# Patient Record
Sex: Male | Born: 1955 | Race: White | Hispanic: No | Marital: Single | Smoking: Current every day smoker
Health system: Southern US, Community
[De-identification: ages and names within clinical notes are randomized; demographics above are authoritative.]

## PROBLEM LIST (undated history)

## (undated) DIAGNOSIS — F419 Anxiety disorder, unspecified: Secondary | ICD-10-CM

## (undated) DIAGNOSIS — Z720 Tobacco use: Secondary | ICD-10-CM

## (undated) HISTORY — DX: Tobacco use: Z72.0

---

## 2001-02-23 ENCOUNTER — Encounter: Payer: Self-pay | Admitting: *Deleted

## 2001-02-23 ENCOUNTER — Emergency Department (HOSPITAL_COMMUNITY): Admission: EM | Admit: 2001-02-23 | Discharge: 2001-02-24 | Payer: Self-pay | Admitting: *Deleted

## 2004-03-17 HISTORY — PX: HERNIA REPAIR: SHX51

## 2004-09-23 ENCOUNTER — Encounter (INDEPENDENT_AMBULATORY_CARE_PROVIDER_SITE_OTHER): Payer: Self-pay | Admitting: *Deleted

## 2004-09-23 ENCOUNTER — Ambulatory Visit (HOSPITAL_COMMUNITY): Admission: RE | Admit: 2004-09-23 | Discharge: 2004-09-23 | Payer: Self-pay | Admitting: General Surgery

## 2008-07-14 ENCOUNTER — Emergency Department (HOSPITAL_COMMUNITY): Admission: EM | Admit: 2008-07-14 | Discharge: 2008-07-14 | Payer: Self-pay | Admitting: Emergency Medicine

## 2008-12-30 ENCOUNTER — Emergency Department (HOSPITAL_COMMUNITY): Admission: EM | Admit: 2008-12-30 | Discharge: 2008-12-30 | Payer: Self-pay | Admitting: Family Medicine

## 2009-01-03 ENCOUNTER — Emergency Department (HOSPITAL_COMMUNITY): Admission: EM | Admit: 2009-01-03 | Discharge: 2009-01-03 | Payer: Self-pay | Admitting: Emergency Medicine

## 2009-04-17 ENCOUNTER — Emergency Department (HOSPITAL_COMMUNITY): Admission: EM | Admit: 2009-04-17 | Discharge: 2009-04-17 | Payer: Self-pay | Admitting: Family Medicine

## 2010-06-26 LAB — DIFFERENTIAL
Basophils Absolute: 0.1 10*3/uL (ref 0.0–0.1)
Basophils Relative: 1 % (ref 0–1)
Eosinophils Absolute: 0.1 10*3/uL (ref 0.0–0.7)
Neutro Abs: 4.7 10*3/uL (ref 1.7–7.7)
Neutrophils Relative %: 58 % (ref 43–77)

## 2010-06-26 LAB — BASIC METABOLIC PANEL
BUN: 10 mg/dL (ref 6–23)
CO2: 25 mEq/L (ref 19–32)
Calcium: 9.4 mg/dL (ref 8.4–10.5)
Creatinine, Ser: 0.89 mg/dL (ref 0.4–1.5)
GFR calc Af Amer: >60 mL/min (ref >60)
Glucose, Bld: 95 mg/dL (ref 70–99)

## 2010-06-26 LAB — POCT CARDIAC MARKERS
Myoglobin, poc: 30.9 ng/mL (ref 12–200)
Myoglobin, poc: 43 ng/mL (ref 12–200)
Troponin i, poc: <0.05 ng/mL (ref 0.00–0.09)

## 2010-06-26 LAB — CBC
MCHC: 34.9 g/dL (ref 30.0–36.0)
RDW: 13.5 % (ref 11.5–15.5)

## 2010-08-02 NOTE — Op Note (Signed)
NAME:  Gabriel Barry, Gabriel Barry NO.:  [id number]   MEDICAL RECORD NO.:  [id number]          PATIENT TYPE:  AMB   LOCATION:  DAY                          FACILITY:  [facility]   PHYSICIAN:  Ollen Gross. Vernell Morgans, M.D. DATE OF BIRTH:  1956/03/06   DATE OF PROCEDURE:  09/23/2004  DATE OF DISCHARGE:                                 OPERATIVE REPORT   PREOPERATIVE DIAGNOSIS:  Left inguinal hernia.   POSTOP DIAGNOSIS:  Left indirect inguinal hernia.   PROCEDURE:  Left inguinal hernia repair with mesh.   SURGEON:  Ollen Gross. Carolynne Edouard, M.D.   ANESTHESIA:  General endotracheal.   PROCEDURE:  After informed consent was obtained, the patient was brought to  the operating room and placed in the supine position on the operating room  table. After adequate induction of general anesthesia, the patient's abdomen  and left groin were prepped with Betadine and draped in usual sterile  manner. The left groin was then infiltrated 1/4% Marcaine and a small  incision was made from the edge of the pubic tubercle on the left side  towards the anterior iliac spine for a distance of about 5 cm.  This  incision was carried down through the skin and subcutaneous tissue sharply  with the electrocautery. Small bridging vein was encountered that was  clamped with hemostats, divided, and ligated with 3-0 silk ties. The  dissection then carried through the subcutaneous tissue with the  electrocautery until the fascia of the external oblique was encountered.   The external oblique fascia was opened along its fibers towards the apex of  the external ring. Using a 15-blade knife and Metzenbaum scissors and  Weitlaner retractor was deployed to open the wound.  The ilioinguinal nerve  was identified and appeared to be involved with some scar tissue.  It was  ligated proximally and distally with hemostats.  The area was clamped  proximally and distally with hemostats, divided and ligated with 3-0 silk  ties. Blunt  dissection was then carried out at the edge of the pubic  tubercle until the cord structures could be surrounded between 2 fingers. A  1/2-inch Penrose drain was then placed around the cord structures for  retraction purposes. The cord structures were then gently skeletonized by a  combination of blunt hemostat dissection and sharp dissection with the  electrocautery. The hernia sac was identified and was gently separated from  the rest of the cord structures.   The hernia sac was opened. There were no visceral contents within the sac.  The sac was then ligated at its base with a 2-0 silk suture ligature; and  then the sac was excised and sent to pathology for identification. The stump  of the sac was allowed to retract back beneath the transversalis layer.  A  piece of 3 x 6 mesh was then chosen and cut-to-fit.  The mesh was sewed  inferiorly to the shelving edge of the inguinal ligament with a running 2-0  Prolene stitch. Tails were cut in the mesh laterally which were wrapped  around the cord structures superiorly. The mesh was sewed  to the muscular  aponeurotic strength layer of the transversalis using interrupted 2-0  Prolene vertical mattress stitches. The tails, again, were wrapped around  the cord structures and anchored laterally to the shelving edge of the  inguinal ligament with an interrupted 2-0 Prolene stitch.  Once this was accomplished, the mesh was in good position without any  tension.   The wound was irrigated copious amounts of saline. The external oblique  fascia was then reapproximated with a running 2-0 Vicryl stitch. The wound  was then infiltrated with 1/4% Marcaine. The subcutaneous fascia was  reapproximated with a running 3-0 Vicryl stitch, and the skin was closed  with a running for Monocryl subcuticular stitch. Benzoin, Steri-Strips, and  sterile dressings were applied. The patient tolerated the procedure well.  At the end of the case all needle, sponge, and  instrument counts were  correct. The patient was awake and taken recovery in stable condition.       PST/MEDQ  D:  09/23/2004  T:  09/23/2004  Job:  [id number]

## 2010-10-18 ENCOUNTER — Emergency Department (HOSPITAL_COMMUNITY)
Admission: EM | Admit: 2010-10-18 | Discharge: 2010-10-18 | Disposition: A | Discharge status: Home / Self Care | Payer: Self-pay | Attending: Emergency Medicine | Admitting: Emergency Medicine

## 2010-10-18 ENCOUNTER — Emergency Department (HOSPITAL_COMMUNITY): Payer: Self-pay

## 2010-10-18 DIAGNOSIS — F411 Generalized anxiety disorder: Secondary | ICD-10-CM | POA: Insufficient documentation

## 2010-10-18 DIAGNOSIS — R61 Generalized hyperhidrosis: Secondary | ICD-10-CM | POA: Insufficient documentation

## 2010-10-18 DIAGNOSIS — R0789 Other chest pain: Secondary | ICD-10-CM | POA: Insufficient documentation

## 2010-10-18 DIAGNOSIS — R209 Unspecified disturbances of skin sensation: Secondary | ICD-10-CM | POA: Insufficient documentation

## 2010-10-18 DIAGNOSIS — R0602 Shortness of breath: Secondary | ICD-10-CM | POA: Insufficient documentation

## 2010-10-18 DIAGNOSIS — R29898 Other symptoms and signs involving the musculoskeletal system: Secondary | ICD-10-CM | POA: Insufficient documentation

## 2010-10-18 LAB — DIFFERENTIAL
Basophils Absolute: 0 10*3/uL (ref 0.0–0.1)
Lymphocytes Relative: 16 % (ref 12–46)
Monocytes Absolute: 1 10*3/uL (ref 0.1–1.0)
Neutro Abs: 5.9 10*3/uL (ref 1.7–7.7)
Neutrophils Relative %: 71 % (ref 43–77)

## 2010-10-18 LAB — PRO B NATRIURETIC PEPTIDE: Pro B Natriuretic peptide (BNP): 52.2 pg/mL (ref 0–125)

## 2010-10-18 LAB — PROTIME-INR
INR: 0.99 (ref 0.00–1.49)
Prothrombin Time: 13.3 seconds (ref 11.6–15.2)

## 2010-10-18 LAB — CK TOTAL AND CKMB (NOT AT ARMC)
CK, MB: 2.9 ng/mL (ref 0.3–4.0)
Total CK: 104 U/L (ref 7–232)
Total CK: 85 U/L (ref 7–232)

## 2010-10-18 LAB — POCT I-STAT, CHEM 8
BUN: 9 mg/dL (ref 6–23)
Creatinine, Ser: 0.8 mg/dL (ref 0.50–1.35)
Hemoglobin: 14.3 g/dL (ref 13.0–17.0)
Potassium: 3.7 mEq/L (ref 3.5–5.1)
Sodium: 136 mEq/L (ref 135–145)

## 2010-10-18 LAB — APTT: aPTT: 31 seconds (ref 24–37)

## 2010-10-18 LAB — CBC
HCT: 39.5 % (ref 39.0–52.0)
Hemoglobin: 14.1 g/dL (ref 13.0–17.0)
MCHC: 35.7 g/dL (ref 30.0–36.0)
RBC: 4.21 MIL/uL — ABNORMAL LOW (ref 4.22–5.81)

## 2010-10-18 LAB — TROPONIN I: Troponin I: <0.3 ng/mL (ref <0.30)

## 2013-02-27 IMAGING — CR DG CHEST 1V PORT
1 series · 1 of 1 positions shown · non-contrast
Comparison: 01/03/2009

CLINICAL DATA: Chest pain

PORTABLE CHEST - 1 VIEW

[view not recorded]
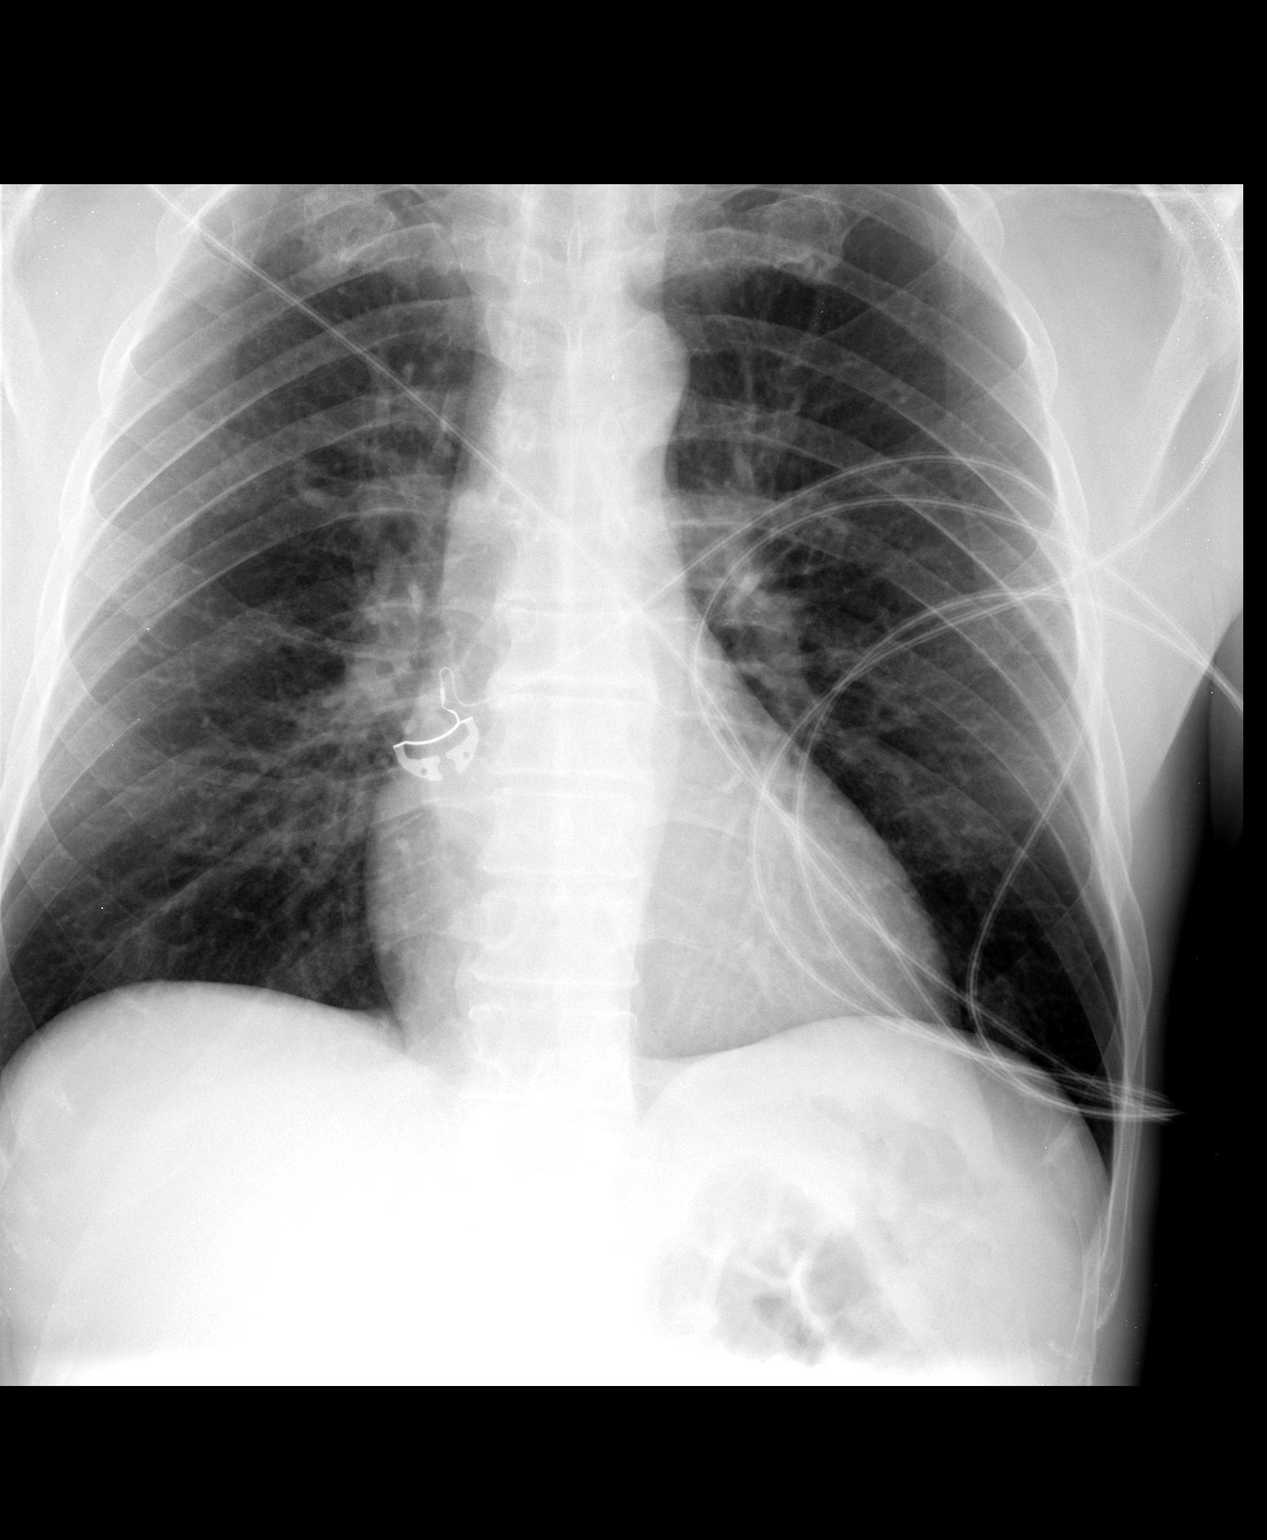

[1 of 1 positions shown; findings below may reference images not displayed]

FINDINGS: The lungs are clear bilaterally.  No confluent airspace
opacities, pleural effuions or pneumothoracies are seen.  The heart
is normal in size and contour.  The upper abdomen and osseous
structures are normal. Of note, the extreme right costophrenic
angle is not included on this exam.
IMPRESSION: No acute cardiopulmonary disease.

## 2013-10-15 ENCOUNTER — Encounter (HOSPITAL_COMMUNITY): Payer: Self-pay | Admitting: Emergency Medicine

## 2013-10-15 ENCOUNTER — Emergency Department (HOSPITAL_COMMUNITY)
Admission: EM | Admit: 2013-10-15 | Discharge: 2013-10-15 | Disposition: A | Discharge status: Home / Self Care | Payer: Self-pay | Attending: Emergency Medicine | Admitting: Emergency Medicine

## 2013-10-15 DIAGNOSIS — S0993XA Unspecified injury of face, initial encounter: Secondary | ICD-10-CM | POA: Insufficient documentation

## 2013-10-15 DIAGNOSIS — S199XXA Unspecified injury of neck, initial encounter: Secondary | ICD-10-CM

## 2013-10-15 DIAGNOSIS — Y9389 Activity, other specified: Secondary | ICD-10-CM | POA: Insufficient documentation

## 2013-10-15 DIAGNOSIS — Z8659 Personal history of other mental and behavioral disorders: Secondary | ICD-10-CM | POA: Insufficient documentation

## 2013-10-15 DIAGNOSIS — IMO0002 Reserved for concepts with insufficient information to code with codable children: Secondary | ICD-10-CM | POA: Insufficient documentation

## 2013-10-15 DIAGNOSIS — Y92410 Unspecified street and highway as the place of occurrence of the external cause: Secondary | ICD-10-CM | POA: Insufficient documentation

## 2013-10-15 DIAGNOSIS — M549 Dorsalgia, unspecified: Secondary | ICD-10-CM

## 2013-10-15 DIAGNOSIS — F17200 Nicotine dependence, unspecified, uncomplicated: Secondary | ICD-10-CM | POA: Insufficient documentation

## 2013-10-15 HISTORY — DX: Anxiety disorder, unspecified: F41.9

## 2013-10-15 MED ORDER — DIAZEPAM 5 MG PO TABS: 5.0000 mg | ORAL_TABLET | Freq: Three times a day (TID) | ORAL | Status: DC | PRN

## 2013-10-15 MED ORDER — IBUPROFEN 800 MG PO TABS: 800.0000 mg | ORAL_TABLET | Freq: Three times a day (TID) | ORAL | Status: DC | PRN

## 2013-10-15 MED ORDER — DIAZEPAM 5 MG PO TABS
5.0000 mg | ORAL_TABLET | Freq: Once | ORAL | Status: AC
  Administered 2013-10-15: 5 mg via ORAL
  Filled 2013-10-15: qty 1
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
## 2013-10-15 NOTE — ED Notes (Signed)
Declined W/C at D/C and was escorted to lobby by RN. 
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
## 2013-10-15 NOTE — ED Provider Notes (Signed)
Medical screening examination/treatment/procedure(s) were performed by non-physician practitioner and as supervising physician I was immediately available for consultation/collaboration.   Megan E Docherty, MD 10/15/13 1428 

## 2013-11-22 ENCOUNTER — Emergency Department (HOSPITAL_COMMUNITY)
Admission: EM | Admit: 2013-11-22 | Discharge: 2013-11-22 | Disposition: A | Discharge status: Home / Self Care | Payer: No Typology Code available for payment source | Attending: Emergency Medicine | Admitting: Emergency Medicine

## 2013-11-22 ENCOUNTER — Encounter (HOSPITAL_COMMUNITY): Payer: Self-pay | Admitting: Emergency Medicine

## 2013-11-22 DIAGNOSIS — R269 Unspecified abnormalities of gait and mobility: Secondary | ICD-10-CM | POA: Insufficient documentation

## 2013-11-22 DIAGNOSIS — M545 Low back pain, unspecified: Secondary | ICD-10-CM | POA: Insufficient documentation

## 2013-11-22 DIAGNOSIS — F411 Generalized anxiety disorder: Secondary | ICD-10-CM | POA: Insufficient documentation

## 2013-11-22 DIAGNOSIS — G8911 Acute pain due to trauma: Secondary | ICD-10-CM | POA: Insufficient documentation

## 2013-11-22 DIAGNOSIS — F17200 Nicotine dependence, unspecified, uncomplicated: Secondary | ICD-10-CM | POA: Insufficient documentation

## 2013-11-22 DIAGNOSIS — M5430 Sciatica, unspecified side: Secondary | ICD-10-CM | POA: Insufficient documentation

## 2013-11-22 MED ORDER — OXYCODONE-ACETAMINOPHEN 5-325 MG PO TABS: 1.0000 | ORAL_TABLET | Freq: Four times a day (QID) | ORAL | Status: DC | PRN

## 2013-11-22 MED ORDER — KETOROLAC TROMETHAMINE 30 MG/ML IJ SOLN
30.0000 mg | Freq: Once | INTRAMUSCULAR | Status: AC
  Administered 2013-11-22: 30 mg via INTRAMUSCULAR
  Filled 2013-11-22: qty 1

## 2013-11-22 MED ORDER — IBUPROFEN 800 MG PO TABS: 800.0000 mg | ORAL_TABLET | Freq: Three times a day (TID) | ORAL | Status: DC

## 2013-11-22 NOTE — Discharge Instructions (Signed)
Call for a follow up appointment with a Family or Primary Care Provider.  Return if Symptoms worsen.   Take medication as prescribed.  Do not operate heavy machinery or drink alcohol while taking narcotic drugs.

## 2013-11-22 NOTE — ED Notes (Signed)
Pt reports he was in [facility] on July 30th and since he has lower back pain. Pt had referral to neuro surgeon but he wasn't able to go due to financial diffculty. Pt states pain worse with movement. Pt took Advil with minimal relief. Denies urinary/bowel incontinence. Ambulatory to triage.
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
## 2013-11-28 NOTE — ED Provider Notes (Signed)
Medical screening examination/treatment/procedure(s) were performed by non-physician practitioner and as supervising physician I was immediately available for consultation/collaboration.   EKG Interpretation None       Arby Barrette, MD 11/28/13 2333

## 2015-09-02 ENCOUNTER — Observation Stay (HOSPITAL_COMMUNITY)
Admission: EM | Admit: 2015-09-02 | Discharge: 2015-09-03 | Disposition: A | Discharge status: Home / Self Care | Payer: No Typology Code available for payment source | Attending: Student in an Organized Health Care Education/Training Program | Admitting: Student in an Organized Health Care Education/Training Program

## 2015-09-02 ENCOUNTER — Encounter (HOSPITAL_COMMUNITY): Payer: Self-pay | Admitting: Emergency Medicine

## 2015-09-02 DIAGNOSIS — M19041 Primary osteoarthritis, right hand: Secondary | ICD-10-CM

## 2015-09-02 DIAGNOSIS — T63061A Toxic effect of venom of other North and South American snake, accidental (unintentional), initial encounter: Secondary | ICD-10-CM | POA: Insufficient documentation

## 2015-09-02 DIAGNOSIS — Z791 Long term (current) use of non-steroidal anti-inflammatories (NSAID): Secondary | ICD-10-CM | POA: Insufficient documentation

## 2015-09-02 DIAGNOSIS — F419 Anxiety disorder, unspecified: Secondary | ICD-10-CM

## 2015-09-02 DIAGNOSIS — F17210 Nicotine dependence, cigarettes, uncomplicated: Secondary | ICD-10-CM | POA: Insufficient documentation

## 2015-09-02 DIAGNOSIS — F17200 Nicotine dependence, unspecified, uncomplicated: Secondary | ICD-10-CM

## 2015-09-02 DIAGNOSIS — S61231A Puncture wound without foreign body of left index finger without damage to nail, initial encounter: Principal | ICD-10-CM | POA: Insufficient documentation

## 2015-09-02 DIAGNOSIS — M19042 Primary osteoarthritis, left hand: Secondary | ICD-10-CM

## 2015-09-02 DIAGNOSIS — W5911XA Bitten by nonvenomous snake, initial encounter: Secondary | ICD-10-CM | POA: Diagnosis present

## 2015-09-02 DIAGNOSIS — F1121 Opioid dependence, in remission: Secondary | ICD-10-CM

## 2015-09-02 DIAGNOSIS — R030 Elevated blood-pressure reading, without diagnosis of hypertension: Secondary | ICD-10-CM | POA: Insufficient documentation

## 2015-09-02 DIAGNOSIS — T63001A Toxic effect of unspecified snake venom, accidental (unintentional), initial encounter: Secondary | ICD-10-CM

## 2015-09-02 DIAGNOSIS — X58XXXA Exposure to other specified factors, initial encounter: Secondary | ICD-10-CM | POA: Insufficient documentation

## 2015-09-02 LAB — CBC WITH DIFFERENTIAL/PLATELET
BASOS ABS: 0.1 10*3/uL (ref 0.0–0.1)
BASOS ABS: 0.1 10*3/uL (ref 0.0–0.1)
BASOS PCT: 1 %
BASOS PCT: 1 %
Basophils Absolute: 0 10*3/uL (ref 0.0–0.1)
Basophils Relative: 0 %
Eosinophils Absolute: 0.2 10*3/uL (ref 0.0–0.7)
Eosinophils Absolute: 0.2 10*3/uL (ref 0.0–0.7)
Eosinophils Absolute: 0.1 10*3/uL (ref 0.0–0.7)
Eosinophils Relative: 2 %
Eosinophils Relative: 1 %
Eosinophils Relative: 2 %
HEMATOCRIT: 38.7 % — AB (ref 39.0–52.0)
HEMATOCRIT: 38.5 % — AB (ref 39.0–52.0)
HEMATOCRIT: 38.4 % — AB (ref 39.0–52.0)
HEMOGLOBIN: 12.8 g/dL — AB (ref 13.0–17.0)
HEMOGLOBIN: 12.6 g/dL — AB (ref 13.0–17.0)
Hemoglobin: 13 g/dL (ref 13.0–17.0)
LYMPHS ABS: 2.2 10*3/uL (ref 0.7–4.0)
Lymphocytes Relative: 22 %
Lymphocytes Relative: 23 %
Lymphocytes Relative: 27 %
Lymphs Abs: 2.2 10*3/uL (ref 0.7–4.0)
Lymphs Abs: 2.5 10*3/uL (ref 0.7–4.0)
MCH: 30.8 pg (ref 26.0–34.0)
MCH: 31.2 pg (ref 26.0–34.0)
MCH: 30.5 pg (ref 26.0–34.0)
MCHC: 33.6 g/dL (ref 30.0–36.0)
MCHC: 33.2 g/dL (ref 30.0–36.0)
MCHC: 32.8 g/dL (ref 30.0–36.0)
MCV: 91.7 fL (ref 78.0–100.0)
MCV: 93.9 fL (ref 78.0–100.0)
MCV: 93 fL (ref 78.0–100.0)
MONO ABS: 0.8 10*3/uL (ref 0.1–1.0)
MONOS PCT: 7 %
MONOS PCT: 10 %
Monocytes Absolute: 0.7 10*3/uL (ref 0.1–1.0)
Monocytes Absolute: 0.8 10*3/uL (ref 0.1–1.0)
Monocytes Relative: 8 %
NEUTROS ABS: 6.9 10*3/uL (ref 1.7–7.7)
NEUTROS ABS: 7.6 10*3/uL (ref 1.7–7.7)
NEUTROS ABS: 5 10*3/uL (ref 1.7–7.7)
NEUTROS PCT: 68 %
NEUTROS PCT: 61 %
Neutrophils Relative %: 67 %
PLATELETS: 200 10*3/uL (ref 150–400)
Platelets: 193 10*3/uL (ref 150–400)
Platelets: 188 10*3/uL (ref 150–400)
RBC: 4.22 MIL/uL (ref 4.22–5.81)
RBC: 4.1 MIL/uL — ABNORMAL LOW (ref 4.22–5.81)
RBC: 4.13 MIL/uL — ABNORMAL LOW (ref 4.22–5.81)
RDW: 13.4 % (ref 11.5–15.5)
RDW: 13.6 % (ref 11.5–15.5)
RDW: 13.5 % (ref 11.5–15.5)
WBC: 10 10*3/uL (ref 4.0–10.5)
WBC: 11 10*3/uL — ABNORMAL HIGH (ref 4.0–10.5)
WBC: 8.1 10*3/uL (ref 4.0–10.5)

## 2015-09-02 LAB — PROTIME-INR
INR: 1.05 (ref 0.00–1.49)
INR: 1.13 (ref 0.00–1.49)
Prothrombin Time: 13.9 seconds (ref 11.6–15.2)
Prothrombin Time: 14.7 seconds (ref 11.6–15.2)

## 2015-09-02 LAB — BASIC METABOLIC PANEL
ANION GAP: 5 (ref 5–15)
BUN: 9 mg/dL (ref 6–20)
CALCIUM: 8.9 mg/dL (ref 8.9–10.3)
CHLORIDE: 108 mmol/L (ref 101–111)
CO2: 26 mmol/L (ref 22–32)
Creatinine, Ser: 0.87 mg/dL (ref 0.61–1.24)
GFR calc non Af Amer: >60 mL/min (ref >60)
Glucose, Bld: 93 mg/dL (ref 65–99)
Potassium: 3.7 mmol/L (ref 3.5–5.1)
Sodium: 139 mmol/L (ref 135–145)

## 2015-09-02 LAB — APTT: APTT: 34 s (ref 24–37)

## 2015-09-02 LAB — SEDIMENTATION RATE: SED RATE: 1 mm/h (ref 0–16)

## 2015-09-02 LAB — FIBRINOGEN: Fibrinogen: 241 mg/dL (ref 204–475)

## 2015-09-02 LAB — CK: Total CK: 96 U/L (ref 49–397)

## 2015-09-02 LAB — C-REACTIVE PROTEIN: CRP: <0.5 mg/dL (ref <1.0)

## 2015-09-02 LAB — GLUCOSE, CAPILLARY: Glucose-Capillary: 102 mg/dL — ABNORMAL HIGH (ref 65–99)

## 2015-09-02 MED ORDER — ENOXAPARIN SODIUM 40 MG/0.4ML ~~LOC~~ SOLN: 40.0000 mg | SUBCUTANEOUS | Status: DC

## 2015-09-02 MED ORDER — IBUPROFEN 200 MG PO TABS
600.0000 mg | ORAL_TABLET | Freq: Once | ORAL | Status: AC
  Administered 2015-09-02: 600 mg via ORAL
  Filled 2015-09-02: qty 3

## 2015-09-02 MED ORDER — SODIUM CHLORIDE 0.9 % IV SOLN
INTRAVENOUS | Status: DC
  Administered 2015-09-02: 15:00:00 via INTRAVENOUS

## 2015-09-02 MED ORDER — HYDROMORPHONE HCL 1 MG/ML IJ SOLN
1.0000 mg | Freq: Once | INTRAMUSCULAR | Status: AC
  Administered 2015-09-02: 1 mg via INTRAVENOUS
  Filled 2015-09-02: qty 1

## 2015-09-02 MED ORDER — SODIUM CHLORIDE 0.9 % IV BOLUS (SEPSIS)
1000.0000 mL | Freq: Once | INTRAVENOUS | Status: AC
  Administered 2015-09-02: 1000 mL via INTRAVENOUS

## 2015-09-02 MED ORDER — TETANUS-DIPHTH-ACELL PERTUSSIS 5-2.5-18.5 LF-MCG/0.5 IM SUSP
0.5000 mL | Freq: Once | INTRAMUSCULAR | Status: AC
  Administered 2015-09-02: 0.5 mL via INTRAMUSCULAR
  Filled 2015-09-02: qty 0.5

## 2015-09-02 MED ORDER — OXYCODONE HCL 5 MG PO TABS
5.0000 mg | ORAL_TABLET | ORAL | Status: DC | PRN
  Administered 2015-09-02 – 2015-09-03 (×4): 5 mg via ORAL
  Filled 2015-09-02 (×4): qty 1

## 2015-09-02 MED ORDER — NICOTINE 21 MG/24HR TD PT24
21.0000 mg | MEDICATED_PATCH | Freq: Every day | TRANSDERMAL | Status: DC
Start: 2015-09-02 — End: 2015-09-03
  Administered 2015-09-02 – 2015-09-03 (×2): 21 mg via TRANSDERMAL
  Filled 2015-09-02 (×2): qty 1

## 2015-09-02 NOTE — ED Notes (Signed)
[facility] bite to L inner index finger, with one fang mark. Pt was outside working, lifting object and felt a stinging bite, looked down and saw what he described as an "light colored snake about 1 foot long". Pt thinks snake was copperhead but "it looked different".  L finger is swollen and red with numbness present in L index fingertip, able to move finger slightly.

## 2015-09-02 NOTE — ED Notes (Signed)
The pt is alert sitting with family

## 2015-09-02 NOTE — H&P (Signed)
Date: 09/02/2015               Patient Name:  Gabriel Barry MRN: [id number]  DOB: 1955-10-28 Age / Sex: 60 y.o., male   PCP: No primary care provider on file.         Medical Service: Internal Medicine Teaching Service         Attending Physician: Dr. Axel Filler, MD    First Contact: Dr. Jule Barry Pager: [phone]  Second Contact: Dr. Julious Barry Pager: [phone]       After Hours (After 5p/  First Contact Pager: [phone]  weekends / holidays): Second Contact Pager: [phone]   Chief Complaint: Snake bite  History of Present Illness: [name] is 60 y.o. man with PMHx that includes anxiety and arthritis who presents to the ED this afternoon after getting bit on the left index finger by what he believes to be a Copperhead snake.  He reports working outside when he felt as if he had been stung by a bee but looked and saw, in actuality, he was bitten by a snake.  He described a burning sensation that felt like his finger was on fire, rated 10/10 at its worst but improved to 7/10 after IV pain medications in the ED.  He reported swelling to his wrist, decreased ROM, and decreased sensation of his finger that have all improved since coming to the ED.  He denies any headache, nausea, vomiting, abdominal pain, shortness of breath, chest pain, or lightheadedness.  He does have swelling of multiple hand joints that is chronic.  Social Hx: he is self-employed working in [employer], smokes 0.5-1 PPD for 30+ years, drinks EtOH socially, is married with no biological children.  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  He reports almost daily use of Suboxone.  Meds: Current Facility-Administered Medications  Medication Dose Route Frequency Provider Last Rate Last Dose  . enoxaparin (LOVENOX) injection 40 mg  40 mg Subcutaneous Q24H Gabriel Herrlich, MD      . nicotine  (NICODERM CQ - dosed in mg/24 hours) patch 21 mg  21 mg Transdermal Daily Gabriel Herrlich, MD      . oxyCODONE (Oxy IR/ROXICODONE) immediate release tablet 5 mg  5 mg Oral Q4H PRN Gabriel Herrlich, MD       Current Outpatient Prescriptions  Medication Sig Dispense Refill  . acetaminophen (TYLENOL) 500 MG tablet Take 500 mg by mouth every 6 (six) hours as needed for mild pain.    . diazepam (VALIUM) 5 MG tablet Take 2.5 mg by mouth every 8 (eight) hours as needed for muscle spasms.    Marland Kitchen ibuprofen (ADVIL,MOTRIN) 800 MG tablet Take 800 mg by mouth every 8 (eight) hours as needed for moderate pain.    Marland Kitchen ibuprofen (ADVIL,MOTRIN) 800 MG tablet Take 1 tablet (800 mg total) by mouth 3 (three) times daily. 21 tablet 0  . oxyCODONE-acetaminophen (PERCOCET) 5-325 MG per tablet Take 1 tablet by mouth every 6 (six) hours as needed. 10 tablet 0    Allergies: Allergies as of 09/02/2015  . (No Known Allergies)   Past Medical History  Diagnosis Date  . Anxiety    Past Surgical History  Procedure Laterality Date  . Hernia repair  2006   No family history on file. Social History   Social History  . Marital Status: Single  Spouse Name: N/A  . Number of Children: N/A  . Years of Education: N/A   Occupational History  . Not on file.   Social History Main Topics  . Smoking status: Current Every Day Smoker -- 1.50 packs/day    Types: Cigarettes  . Smokeless tobacco: Never Used  . Alcohol Use: 0.6 oz/week    1 Cans of beer per week     Comment: social  . Drug Use: No  . Sexual Activity: Yes    Birth Control/ Protection: None   Other Topics Concern  . Not on file   Social History Narrative    Review of Systems: Pertinent items are noted in HPI.  Physical Exam: Blood pressure 138/87, pulse 58, resp. rate 15, SpO2 97 %.   General: sitting up in bed, pleasant, mentating well and in no distress HEENT: EOMI, no scleral icterus Cardiac: RRR, no rubs, murmurs or gallops Pulm: clear to  auscultation bilaterally, normal effort Abd: soft, nontender, nondistended, BS present Ext: LEFT index finger and dorsum of wrist mildly tender, radial pulses intact, LEFT hand shows puncture wound at the lateral aspect of PIP joint.  Mild swelling noted to the dorsal aspect of hand and marked with pen.  Slightly decreased sensation to tip of LEFT index finger.  Full ROM with wrist flexion/extension, eversion/inversion.  Decreased ROM with flexion of his LEFT index finger.  Full ROM on the RIGHT.  Otherwise, he has fullness of the MCP joints as well as other PIP and DIP joints bilaterally Skin: areas of hypopigmentation on bilateral hands present from materials exposure greater than 1 year ago that do not bother patient.  Multiple tattoos Neuro: alert and oriented X3, cranial nerves II-XII grossly intact   Lab results: Basic Metabolic Panel:  Recent Labs  09/02/15 1344  NA 139  K 3.7  CL 108  CO2 26  GLUCOSE 93  BUN 9  CREATININE 0.87  CALCIUM 8.9   CBC:  Recent Labs  09/02/15 1344  WBC 10.0  NEUTROABS 6.9  HGB 13.0  HCT 38.7*  MCV 91.7  PLT 200   Coagulation:  Recent Labs  09/02/15 1344  LABPROT 13.9  INR 1.05   Imaging results:  No results found.  Other results: EKG: none  Assessment & Plan by Problem: Active Problems:   Snake bite  Snake Bite:  Patient appears to have minor envenomation or dry bite with swelling and tenderness at the site of snake bite that is no more than minimal and not progressing currently.  He has normal PT/INR and fibrinogen. CBC also unremarkable and BMET normal.  He has no systemic signs.  Based on this, antivenom is not indicated for administration at this time.  Poison control was contacted who recommended if swelling were to increase or progress past the wrist or patient were to develop systemic symptoms, then CroFab should be given - continue to monitor, if CroFab to be given patient should be monitored in ICU due to concern for  possible hypersensitivity reaction - re-check PT/INR, aPTT once tonight and again in the morning - CBC Q4H for 3 occurences - CMET in the morning - check CK and EKG as well - Tdap given in the ED - pain control: Oxycodone 81m Q4H as needed - no role for antibiotics at this point as snake bites may result in the inoculation of bacteria, but infection is rare. Antibiotics (such as ampicillin-sulbactam or amoxicillin-clavulanate) should only be administered for established infections or for heavily contaminated wounds.  Arthritis: patient has multiple joint swelling of bilateral PIP and DIP joints with MCP fullness.  No personal or family history of any autoimmune conditions.  He reports that his arthritis is worse in the evening after work but is suspicious for potential autoimmune etiology versus degenerative osteoarthritis - check ESR, CRP, RF, anti-CCP - consider ANA, ds-DNA, anti-Smith if suspicious for other autoimmune etiology and RA labs negative - pain control as above  Elevated Blood Pressure:  Patients BP elevated at admission to the 997-877C systolic.  He denies history of HTN other than once when he had a tooth ache and his dentist mentioned it.  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  He reports a high tolerance to opiates - as Suboxone would decrease the effectiveness of oxycodone, will hold this and treat his acute pain with oxycodone as above - may increase if needed but will plan to be judicious to start  Anxiety: patient with self-reported history of anxiety and diazepam on his home medication list, however, he reports not taking anything currently  for his anxiety - continue to monitor - will not give benzodiazepines right now  Tobacco Abuse - nicotine patch - cessation counseling  Health Maintenance - check HIV, HCV  DVT PPx: Lovenox SQ  Diet: Regular  CODE: FULL  Dispo: Disposition is deferred at this time, awaiting improvement of current medical problems. Anticipated discharge in approximately 1-2 day(s).   The patient does not have a current PCP (No primary care provider on file.) and does need an [facility] hospital follow-up appointment after discharge.  The patient does not have transportation limitations that hinder transportation to clinic appointments.  Signed: Jule Ser, DO 09/02/2015, 5:40 PM

## 2015-09-02 NOTE — ED Provider Notes (Signed)
CSN: [id number]     Arrival date & time 09/02/15  1310 History   First MD Initiated Contact with Patient 09/02/15 1314     Chief Complaint  Patient presents with  . Snake Bite     (Consider location/radiation/quality/duration/timing/severity/associated sxs/prior Treatment) HPI   Patient is a 60 year old male with a history of anxiety presents the ED after a snake bite to his left pointer finger. Patient states he was working outside when he felt a sting on his finger and saw what he believes is a copper head. He is complaining of pain in his left pointer finger that is sharp, constant, severe, nonradiating and associated numbness to his fingertip. Patient denies headache, nausea, vomiting, syncope, abdominal pain.  Past Medical History  Diagnosis Date  . Anxiety    Past Surgical History  Procedure Laterality Date  . Hernia repair  2006   No family history on file. Social History  Substance Use Topics  . Smoking status: Current Every Day Smoker -- 1.50 packs/day    Types: Cigarettes  . Smokeless tobacco: Never Used  . Alcohol Use: 0.6 oz/week    1 Cans of beer per week     Comment: social    Review of Systems  Respiratory: Negative for shortness of breath.   Cardiovascular: Negative for chest pain.  Gastrointestinal: Negative for nausea and vomiting.  Musculoskeletal: Positive for joint swelling.  Skin: Positive for wound.  Neurological: Positive for numbness. Negative for syncope.      Allergies  Review of patient's allergies indicates no known allergies.  Home Medications   Prior to Admission medications   Medication Sig Start Date End Date Taking? Authorizing Provider  acetaminophen (TYLENOL) 500 MG tablet Take 500 mg by mouth every 6 (six) hours as needed for mild pain.   Yes Historical Provider, MD  diazepam (VALIUM) 5 MG tablet Take 2.5 mg by mouth every 8 (eight) hours as needed for muscle spasms. 10/15/13   Harle Battiest, NP  ibuprofen (ADVIL,MOTRIN) 800  MG tablet Take 800 mg by mouth every 8 (eight) hours as needed for moderate pain. 10/15/13   Harle Battiest, NP  ibuprofen (ADVIL,MOTRIN) 800 MG tablet Take 1 tablet (800 mg total) by mouth 3 (three) times daily. 11/22/13   Mellody Drown, PA-C  oxyCODONE-acetaminophen (PERCOCET) 5-325 MG per tablet Take 1 tablet by mouth every 6 (six) hours as needed. 11/22/13   Lauren Parker, PA-C   BP 140/87 mmHg  Pulse 64  Resp 13  SpO2 98%  Physical Exam  Constitutional: He appears well-developed and well-nourished. No distress.  HENT:  Head: Normocephalic and atraumatic.  Eyes: Conjunctivae are normal.  Cardiovascular: Normal rate, regular rhythm and normal heart sounds.  Exam reveals no gallop and no friction rub.   No murmur heard. Pulses:      Radial pulses are 2+ on the right side, and 2+ on the left side.  Pulmonary/Chest: Effort normal.  Musculoskeletal:       Hands: Examination of left hand revealed a puncture wound at the pip joint on the lateral aspect of the finger, redness and swelling noted into the dorsal aspect of the hand, swelling has been marked with a pen, cap Refill less than 2 seconds, decreased sensation to the finger from the PIP joint distally.  Neurological: He is alert. Coordination normal.  Skin: Skin is warm and dry.  Psychiatric: He has a normal mood and affect. His behavior is normal.    ED Course  Procedures (including critical care time)  Labs Review Labs Reviewed  CBC WITH DIFFERENTIAL/PLATELET - Abnormal; Notable for the following:    HCT 38.7 (*)    All other components within normal limits  PROTIME-INR  FIBRINOGEN  BASIC METABOLIC PANEL  CBC WITH DIFFERENTIAL/PLATELET  CBC WITH DIFFERENTIAL/PLATELET    Imaging Review No results found. I have personally reviewed and evaluated these images and lab results as part of my medical decision-making.   EKG Interpretation None      MDM   Final diagnoses:  Snake bite, accidental or unintentional, initial  encounter   Patient with a snake bite. Poison control suggested PT/INR, CBC, fibrinogen labs 6 hours post bite. They also recommended if there is swelling past the wrist or systemic symptoms to administer CroFab. Patient's pain well controlled in the ED. Patient's swelling outlined every 1-3 hours. Labs at this point unremarkable.  I consulted the hospitalist team and spoke with Dr. Vivianne Masterroung who will admit the patient for observation and further evaluation and treatment. Thank you Dr. Vivianne Masterroung for your attention and care of this patient.    Jerre SimonJessica L Rondo Spittler, PA 09/02/15 1614  Gerhard Munchobert Lockwood, MD 09/03/15 [phone]

## 2015-09-02 NOTE — ED Notes (Signed)
Report called to rn on 5 w 

## 2015-09-02 NOTE — ED Notes (Signed)
Admitting doctor at  The bedside 

## 2015-09-02 NOTE — Progress Notes (Signed)
NURSING PROGRESS NOTE  Gabriel Barry [id number] Admission Data: 09/02/2015 6:03 PM Attending Provider: Tyson Aliasuncan Thomas Vincent, MD PCP:No primary care provider on file. Code Status: FULL  Gabriel Barry is a 60 y.o. male patient admitted from ED:  -No acute distress noted.  -No complaints of shortness of breath.  -No complaints of chest pain.    Blood pressure 137/90, pulse 54, temperature 98.1 F (36.7 C), resp. rate 18, SpO2 99 %.   IV Fluids:  IV in place, occlusive dsg intact without redness, IV cath antecubital left, condition patent and no redness none.   Allergies:  Review of patient's allergies indicates no known allergies.  Past Medical History:   has a past medical history of Anxiety.  Past Surgical History:   has past surgical history that includes Hernia repair (2006).  Social History:   reports that he has been smoking Cigarettes.  He has been smoking about 1.50 packs per day. He has never used smokeless tobacco. He reports that he drinks about 0.6 oz of alcohol per week. He reports that he does not use illicit drugs.  Skin: Left hand swelling noted. 4 sites are marked for measurements. Measured starting at finger tip, down to wrist. 7.25cm at finger tip, 8.5 cm mid finger, 23.5 cm, 17.2 cm at wrist.   Patient/Family orientated to room. Information packet given to patient/family. Admission inpatient armband information verified with patient/family to include name and date of birth and placed on patient arm. Side rails up x 2, fall assessment and education completed with patient/family. Patient/family able to verbalize understanding of risk associated with falls and verbalized understanding to call for assistance before getting out of bed. Call light within reach. Patient/family able to voice and demonstrate understanding of unit orientation instructions.    Will continue to evaluate and treat per MD orders.  Bennie Pieriniyndi Manasvi Dickard, RN

## 2015-09-03 ENCOUNTER — Telehealth: Payer: Self-pay | Admitting: Internal Medicine

## 2015-09-03 DIAGNOSIS — Y9289 Other specified places as the place of occurrence of the external cause: Secondary | ICD-10-CM

## 2015-09-03 DIAGNOSIS — T63091A Toxic effect of venom of other snake, accidental (unintentional), initial encounter: Secondary | ICD-10-CM

## 2015-09-03 DIAGNOSIS — M7989 Other specified soft tissue disorders: Secondary | ICD-10-CM

## 2015-09-03 LAB — APTT: APTT: 33 s (ref 24–37)

## 2015-09-03 LAB — BASIC METABOLIC PANEL
ANION GAP: 3 — AB (ref 5–15)
BUN: 10 mg/dL (ref 6–20)
CALCIUM: 8.7 mg/dL — AB (ref 8.9–10.3)
CO2: 26 mmol/L (ref 22–32)
CREATININE: 0.82 mg/dL (ref 0.61–1.24)
Chloride: 109 mmol/L (ref 101–111)
GFR calc Af Amer: >60 mL/min (ref >60)
GLUCOSE: 95 mg/dL (ref 65–99)
Potassium: 4.1 mmol/L (ref 3.5–5.1)
Sodium: 138 mmol/L (ref 135–145)

## 2015-09-03 LAB — RHEUMATOID FACTOR: RHEUMATOID FACTOR: 15.9 [IU]/mL — AB (ref 0.0–13.9)

## 2015-09-03 LAB — GLUCOSE, CAPILLARY: Glucose-Capillary: 97 mg/dL (ref 65–99)

## 2015-09-03 LAB — HIV ANTIBODY (ROUTINE TESTING W REFLEX): HIV Screen 4th Generation wRfx: NONREACTIVE

## 2015-09-03 LAB — PROTIME-INR
INR: 1.05 (ref 0.00–1.49)
PROTHROMBIN TIME: 13.9 s (ref 11.6–15.2)

## 2015-09-03 LAB — CYCLIC CITRUL PEPTIDE ANTIBODY, IGG/IGA: CCP ANTIBODIES IGG/IGA: 22 U — AB (ref 0–19)

## 2015-09-03 MED ORDER — NICOTINE 21 MG/24HR TD PT24: 21.0000 mg | MEDICATED_PATCH | Freq: Every day | TRANSDERMAL | Status: AC

## 2015-09-03 NOTE — Progress Notes (Signed)
Subjective: Patient seen and examined this morning on rounds.  No acute events noted overnight.  Patient reports feeling better.  Still does have mild swelling and pain that has improved overnight.   Objective: Vital signs in last 24 hours: Filed Vitals:   09/02/15 1700 09/02/15 1757 09/02/15 2131 09/03/15 0544  BP: 138/87 137/90 132/77 137/69  Pulse: 58 54 70 59  Temp:  98.1 F (36.7 C) 98.4 F (36.9 C) 97.9 F (36.6 C)  TempSrc:   Oral Oral  Resp: 15 18 17 18   Height:  5' 11"  (1.803 m)    Weight:  138 lb 1.6 oz (62.642 kg)    SpO2: 97% 99% 96% 99%   Weight change:   Intake/Output Summary (Last 24 hours) at 09/03/15 1019 Last data filed at 09/03/15 0718  Gross per 24 hour  Intake   1480 ml  Output   2000 ml  Net   -520 ml   General: sitting up in bed, no distress HEENT: EOMI, no scleral icterus Pulm: normal effort Ext: LEFT index finger and dorsum of wrist with mild tenderness and improved swelling.  Good capillary refill and perfusion.  Decreased ROM of his LEFT index finger is improving.  Otherwise, fullness of the RIGHT first MCP joint as well as DIP joints bilaterally Neuro: alert and oriented X3, cranial nerves II-XII grossly intact  Lab Results: Basic Metabolic Panel:  Recent Labs Lab 09/02/15 1344 09/03/15 0651  NA 139 138  K 3.7 4.1  CL 108 109  CO2 26 26  GLUCOSE 93 95  BUN 9 10  CREATININE 0.87 0.82  CALCIUM 8.9 8.7*   CBC:  Recent Labs Lab 09/02/15 1732 09/02/15 2023  WBC 11.0* 8.1  NEUTROABS 7.6 5.0  HGB 12.8* 12.6*  HCT 38.5* 38.4*  MCV 93.9 93.0  PLT 193 188   Cardiac Enzymes:  Recent Labs Lab 09/02/15 1732  CKTOTAL 96   CBG:  Recent Labs Lab 09/02/15 2131 09/03/15 0816  GLUCAP 102* 97   Coagulation:  Recent Labs Lab 09/02/15 1344 09/02/15 2023 09/03/15 0651  LABPROT 13.9 14.7 13.9  INR 1.05 1.13 1.05    Medications: I have reviewed the patient's current medications. Scheduled Meds: . enoxaparin (LOVENOX)  injection  40 mg Subcutaneous Q24H  . nicotine  21 mg Transdermal Daily   Continuous Infusions:  PRN Meds:.oxyCODONE Assessment/Plan: Active Problems:   Snake bite  Snake Bite: Patient doing better this morning regarding pain and swelling that is doing better.  Likely had minor envenomation given mild swelling that did not appear more than minimal and pain that is improving.  His coagulopathy labs have been stable with PT/INR, APPT, and platelets.  BMET is unremarkable and CK was normal.  - discussed with Poison Control regarding disposition and patient able to be discharged home today - will arrange follow up in our clinic this week  - Tdap given yesterday - avoid ice and NSAIDs - keep hand elevated - no role for antibiotics at this point as snake bites may result in the inoculation of bacteria, but infection is rare and is bite does not appear to have penetrated the tendon sheath. Antibiotics (such as ampicillin-sulbactam or amoxicillin-clavulanate) should only be administered for established infections or for heavily contaminated wounds.  Arthritis: patient has multiple joint swelling of bilateral PIP and DIP joints with MCP fullness. No personal or family history of any autoimmune conditions. 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 He reports a high tolerance to opiates - has been receiving Oxycodone prn while inpatient.  Will not discharge home with further opiates and recommend Tylenol for pain  control  Tobacco Abuse - nicotine patch - cessation counseling  Health Maintenance - HIV, HCV pending  DVT PPx: Lovenox SQ  Diet: Regular  CODE: FULL  Dispo: Discharge home today.  The patient does not have a current PCP (No primary care provider on file.) and does need an [facility] hospital follow-up appointment after discharge.  The patient does not have transportation limitations that hinder transportation to clinic appointments.  .Services Needed at time of discharge: Y = Yes, Blank = No PT:   OT:   RN:   Equipment:   Other:       Jule Ser, DO 09/03/2015, 10:19 AM

## 2015-09-03 NOTE — Progress Notes (Signed)
NURSING PROGRESS NOTE  Mal MistyRobert M Belflower [id number] Discharge Data: 09/03/2015 3:25 PM Attending Provider: No att. providers found PCP:No primary care provider on file.     Mal Mistyobert M Miguez to be D/C'd Home per MD order.  Discussed with the patient the After Visit Summary and all questions fully answered. All IV's discontinued with no bleeding noted. All belongings returned to patient for patient to take home.   Last Vital Signs:  Blood pressure 137/69, pulse 59, temperature 97.9 F (36.6 C), temperature source Oral, resp. rate 18, height 5\' 11"  (1.803 m), weight 62.642 kg (138 lb 1.6 oz), SpO2 99 %.  Discharge Medication List   Medication List    STOP taking these medications        diazepam 5 MG tablet  Commonly known as:  VALIUM     ibuprofen 800 MG tablet  Commonly known as:  ADVIL,MOTRIN     oxyCODONE-acetaminophen 5-325 MG tablet  Commonly known as:  PERCOCET      TAKE these medications        acetaminophen 500 MG tablet  Commonly known as:  TYLENOL  Take 500 mg by mouth every 6 (six) hours as needed for mild pain.     nicotine 21 mg/24hr patch  Commonly known as:  NICODERM CQ - dosed in mg/24 hours  Place 1 patch (21 mg total) onto the skin daily.

## 2015-09-03 NOTE — Care Management Note (Signed)
Case Management Note  Patient Details  Name: Gabriel Barry MRN: [id number] Date of Birth: 17-May-1955  Subjective/Objective:                 Patient in obs for snake bite. Patient anticipates DC today. Patient states he will follow with Internal Med clinic after DC. Patient lives at home with wife.   Action/Plan:  Dc to home self care.   Expected Discharge Date:                  Expected Discharge Plan:  Home/Self Care  In-House Referral:  NA  Discharge planning Services  CM Consult  Post Acute Care Choice:  NA Choice offered to:     DME Arranged:    DME Agency:     HH Arranged:    HH Agency:     Status of Service:  Completed, signed off  Medicare Important Message Given:    Date Medicare IM Given:    Medicare IM give by:    Date Additional Medicare IM Given:    Additional Medicare Important Message give by:     If discussed at Long Length of Stay Meetings, dates discussed:    Additional Comments:  Lawerance SabalDebbie Sebastian Lurz, RN 09/03/2015, 11:33 AM

## 2015-09-03 NOTE — Telephone Encounter (Signed)
Need TOC discharge date 09/03/15 HFU 09/06/15 °

## 2015-09-03 NOTE — Discharge Summary (Signed)
Name: Gabriel Barry MRN: [id number] DOB: [birth year] 60 y.o. PCP: No primary care provider on file.  Date of Admission: 09/02/2015  1:10 PM Date of Discharge: 09/03/2015 Attending Physician: Axel Filler, MD  Discharge Diagnosis:  Active Problems:   Snake bite  Discharge Medications:   Medication List    STOP taking these medications        diazepam 5 MG tablet  Commonly known as:  VALIUM     ibuprofen 800 MG tablet  Commonly known as:  ADVIL,MOTRIN     oxyCODONE-acetaminophen 5-325 MG tablet  Commonly known as:  PERCOCET      TAKE these medications        acetaminophen 500 MG tablet  Commonly known as:  TYLENOL  Take 500 mg by mouth every 6 (six) hours as needed for mild pain.     nicotine 21 mg/24hr patch  Commonly known as:  NICODERM CQ - dosed in mg/24 hours  Place 1 patch (21 mg total) onto the skin daily.        Disposition and follow-up:   Gabriel Barry was discharged from [facility] in Stable condition.    1.  At the hospital follow up visit please address:  - is his left hand and index finger swelling and ROM improving? - is his BP normalized? - counsel on smoking cessation - counsel on elicit Suboxone use  2.  Labs / imaging needed at time of follow-up: none needed as anti-venom not given  3.  Pending labs/ test needing follow-up: none  Follow-up Appointments: Follow-up Information    Follow up with Albin Felling, MD On 09/06/2015.   Specialty:  Internal Medicine   Why:  appointment at 9:15am   Contact information:   [place] [address] [phone]       Discharge Instructions:   Consultations:    Procedures Performed:  No results found.  2D Echo: none  Cardiac Cath: none  Admission HPI:  Gabriel Barry is 60 y.o. man with PMHx that includes anxiety and arthritis who presents to the ED this afternoon after getting bit on the left index finger by what he believes to be a  Copperhead snake. He reports working outside when he felt as if he had been stung by a bee but looked and saw, in actuality, he was bitten by a snake. He described a burning sensation that felt like his finger was on fire, rated 10/10 at its worst but improved to 7/10 after IV pain medications in the ED. He reported swelling to his wrist, decreased ROM, and decreased sensation of his finger that have all improved since coming to the ED. He denies any headache, nausea, vomiting, abdominal pain, shortness of breath, chest pain, or lightheadedness. He does have swelling of multiple hand joints that is chronic.  Hospital Course by problem list: Active Problems:   Snake bite   Snake Bite: Patient had what appeared to be a minor envenomation from presumed Copperhead snake bite that did not require administration of Crofab.  He had no abnormalities on his coagulopathy labs, CBC, and BMET that were obtained at appropriate intervals.  His CK was normal and he did not appear to be at risk for increased compartment pressures.  The bite was so small that it was felt there was very little risk for infection and, thus, prophylactic antibiotics were not administered.  Tdap was given in the ED.  Arthritis: patient has multiple joint  swelling of bilateral PIP and DIP joints with MCP fullness of his right hand. No personal or family history of any autoimmune conditions. 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  Autoimmune arthritis was considered but ESR and CRP were normal.  RF and anti-CCP were mildly elevated but clinically his presentation fits more consistently with OA.  Elevated Blood Pressure: Patients BP elevated at admission to the 342-876O systolic. He denies history of HTN other than once when he had a tooth ache and his dentist mentioned it. He reports after resolution of his tooth pain, he checked a BP at the drug store and it was  normal.  His BP normalized overnight but would recommend monitoring closely as an outpatient.  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 He reports a high tolerance to opiates.  His pain was adequately controlled with opiates as an inpatient and, as Suboxone decreases the effectiveness of oxycodone, we held this during hospitalization.  Recommend counseling on not purchasing Suboxone from the streets  Anxiety: patient with self-reported history of anxiety and diazepam on his home medication list, however, he reports not taking anything currently for his anxiety so this was discontinued at discharge.  Tobacco Abuse: nicotine patch and smoking cessation.  Health Maintenance: HIV, HCV negative  Discharge Vitals:   BP 137/69 mmHg  Pulse 59  Temp(Src) 97.9 F (36.6 C) (Oral)  Resp 18  Ht 5' 11"  (1.803 m)  Wt 138 lb 1.6 oz (62.642 kg)  BMI 19.27 kg/m2  SpO2 99%  Discharge Labs:  Results for orders placed or performed during the hospital encounter of 09/02/15 (from the past 24 hour(s))  CBC WITH DIFFERENTIAL     Status: Abnormal   Collection Time: 09/02/15  1:44 PM  Result Value Ref Range   WBC 10.0 4.0 - 10.5 K/uL   RBC 4.22 4.22 - 5.81 MIL/uL   Hemoglobin 13.0 13.0 - 17.0 g/dL   HCT 38.7 (L) 39.0 - 52.0 %   MCV 91.7 78.0 - 100.0 fL   MCH 30.8 26.0 - 34.0 pg   MCHC 33.6 30.0 - 36.0 g/dL   RDW 13.4 11.5 - 15.5 %   Platelets 200 150 - 400 K/uL   Neutrophils Relative % 67 %   Neutro Abs 6.9 1.7 - 7.7 K/uL   Lymphocytes Relative 22 %   Lymphs Abs 2.2 0.7 - 4.0 K/uL   Monocytes Relative 8 %   Monocytes Absolute 0.8 0.1 - 1.0 K/uL   Eosinophils Relative 2 %   Eosinophils Absolute 0.2 0.0 - 0.7 K/uL   Basophils Relative 1 %   Basophils Absolute 0.1 0.0 - 0.1 K/uL  Protime-INR     Status: None   Collection Time: 09/02/15  1:44 PM  Result Value Ref Range    Prothrombin Time 13.9 11.6 - 15.2 seconds   INR 1.05 0.00 - 1.49  Fibrinogen     Status: None   Collection Time: 09/02/15  1:44 PM  Result Value Ref Range   Fibrinogen 241 204 - 475 mg/dL  Basic metabolic panel     Status: None   Collection Time: 09/02/15  1:44 PM  Result Value Ref Range   Sodium 139 135 - 145 mmol/L   Potassium 3.7 3.5 - 5.1 mmol/L   Chloride 108 101 - 111 mmol/L   CO2 26 22 - 32  mmol/L   Glucose, Bld 93 65 - 99 mg/dL   BUN 9 6 - 20 mg/dL   Creatinine, Ser 0.87 0.61 - 1.24 mg/dL   Calcium 8.9 8.9 - 10.3 mg/dL   GFR calc non Af Amer >60 >60 mL/min   GFR calc Af Amer >60 >60 mL/min   Anion gap 5 5 - 15  Sedimentation rate     Status: None   Collection Time: 09/02/15  5:31 PM  Result Value Ref Range   Sed Rate 1 0 - 16 mm/hr  C-reactive protein     Status: None   Collection Time: 09/02/15  5:31 PM  Result Value Ref Range   CRP <0.5 <1.0 mg/dL  CBC WITH DIFFERENTIAL     Status: Abnormal   Collection Time: 09/02/15  5:32 PM  Result Value Ref Range   WBC 11.0 (H) 4.0 - 10.5 K/uL   RBC 4.10 (L) 4.22 - 5.81 MIL/uL   Hemoglobin 12.8 (L) 13.0 - 17.0 g/dL   HCT 38.5 (L) 39.0 - 52.0 %   MCV 93.9 78.0 - 100.0 fL   MCH 31.2 26.0 - 34.0 pg   MCHC 33.2 30.0 - 36.0 g/dL   RDW 13.6 11.5 - 15.5 %   Platelets 193 150 - 400 K/uL   Neutrophils Relative % 68 %   Neutro Abs 7.6 1.7 - 7.7 K/uL   Lymphocytes Relative 23 %   Lymphs Abs 2.5 0.7 - 4.0 K/uL   Monocytes Relative 7 %   Monocytes Absolute 0.7 0.1 - 1.0 K/uL   Eosinophils Relative 1 %   Eosinophils Absolute 0.2 0.0 - 0.7 K/uL   Basophils Relative 1 %   Basophils Absolute 0.1 0.0 - 0.1 K/uL  CK     Status: None   Collection Time: 09/02/15  5:32 PM  Result Value Ref Range   Total CK 96 49 - 397 U/L  CBC WITH DIFFERENTIAL     Status: Abnormal   Collection Time: 09/02/15  8:23 PM  Result Value Ref Range   WBC 8.1 4.0 - 10.5 K/uL   RBC 4.13 (L) 4.22 - 5.81 MIL/uL   Hemoglobin 12.6 (L) 13.0 - 17.0 g/dL   HCT  38.4 (L) 39.0 - 52.0 %   MCV 93.0 78.0 - 100.0 fL   MCH 30.5 26.0 - 34.0 pg   MCHC 32.8 30.0 - 36.0 g/dL   RDW 13.5 11.5 - 15.5 %   Platelets 188 150 - 400 K/uL   Neutrophils Relative % 61 %   Neutro Abs 5.0 1.7 - 7.7 K/uL   Lymphocytes Relative 27 %   Lymphs Abs 2.2 0.7 - 4.0 K/uL   Monocytes Relative 10 %   Monocytes Absolute 0.8 0.1 - 1.0 K/uL   Eosinophils Relative 2 %   Eosinophils Absolute 0.1 0.0 - 0.7 K/uL   Basophils Relative 0 %   Basophils Absolute 0.0 0.0 - 0.1 K/uL  Protime-INR     Status: None   Collection Time: 09/02/15  8:23 PM  Result Value Ref Range   Prothrombin Time 14.7 11.6 - 15.2 seconds   INR 1.13 0.00 - 1.49  APTT     Status: None   Collection Time: 09/02/15  8:23 PM  Result Value Ref Range   aPTT 34 24 - 37 seconds  Glucose, capillary     Status: Abnormal   Collection Time: 09/02/15  9:31 PM  Result Value Ref Range   Glucose-Capillary 102 (H) 65 - 99 mg/dL  Protime-INR  Status: None   Collection Time: 09/03/15  6:51 AM  Result Value Ref Range   Prothrombin Time 13.9 11.6 - 15.2 seconds   INR 1.05 0.00 - 1.49  APTT     Status: None   Collection Time: 09/03/15  6:51 AM  Result Value Ref Range   aPTT 33 24 - 37 seconds  Basic metabolic panel     Status: Abnormal   Collection Time: 09/03/15  6:51 AM  Result Value Ref Range   Sodium 138 135 - 145 mmol/L   Potassium 4.1 3.5 - 5.1 mmol/L   Chloride 109 101 - 111 mmol/L   CO2 26 22 - 32 mmol/L   Glucose, Bld 95 65 - 99 mg/dL   BUN 10 6 - 20 mg/dL   Creatinine, Ser 0.82 0.61 - 1.24 mg/dL   Calcium 8.7 (L) 8.9 - 10.3 mg/dL   GFR calc non Af Amer >60 >60 mL/min   GFR calc Af Amer >60 >60 mL/min   Anion gap 3 (L) 5 - 15  Glucose, capillary     Status: None   Collection Time: 09/03/15  8:16 AM  Result Value Ref Range   Glucose-Capillary 97 65 - 99 mg/dL    Signed: Jule Ser, DO 09/03/2015, 11:48 AM    Services Ordered on Discharge: none Equipment Ordered on Discharge: none

## 2015-09-03 NOTE — Discharge Instructions (Signed)
Mr. Guay,  It was a pleasure taking care of you in the hospital.  I am sorry you got bit by a snake but you should continue to improve over the next few days to 1 week.    Please continue to keep your hand elevated.  Avoid NSAIDs such as ibuprofen or Aleve.  Avoid icing the area.  You may take Tylenol as needed for pain but do not exceed the daily recommended doses. Please seek medical care if you develop signs/symptoms of serum sickness (i.e. Fever, rash, muscle/joint pains). Please follow up with our clinic at the appointment time made for you later this week.  Take care, Dr. Earnestine Leys Bite Snakes may be poisonous (venomous) or nonpoisonous (nonvenomous). A bite from a nonvenomous snake may cause a wound to the skin and possibly to the deeper tissues beneath the skin. A venomous snake will cause a wound and may also inject poison (venom) into the wound.  The effects of snake venom vary depending on the type of snake. In some cases, the effects can be extremely serious or even deadly. A bite from a venomous snake is a medical emergency. Treatment may require the use of antivenom medicine. SYMPTOMS  Symptoms of a snake bite vary depending on the type of snake, whether the snake is venomous, and the severity of the bite. Symptoms for both a venomous or nonvenomous snake may include:   Pain, redness, and swelling at the site of the bite.  Skin discoloration at the site of the bite.   A feeling of nervousness. Symptoms of a venomous snake bite may also include:   Increasing pain and swelling.  Severe anxiety or confusion.  Blood blisters or purple spots in the bite area.   Nausea and vomiting.   Numbness or tingling.   Muscle weakness.   Excessive fatigue or drowsiness.  Excessive sweating.   Difficulty breathing.   Blurred vision.   Bruising and bleeding at the site of the bite.  Feeling faint or light-headed. In some cases, symptoms do not develop until a  few hours after the bite.  DIAGNOSIS  This condition may be diagnosed based on symptoms and a physical exam. Your health care provider will examine the bite area and ask for details about the snake to help determine whether it is venomous. You may also have tests, including blood tests. TREATMENT  Treatment depends on the severity of the bite and whether the snake is venomous.  Treatment for nonvenomous snake bites may involve basic wound care. This often includes cleaning the wound and applying a bandage (dressing). In some cases, antibiotic medicine or a tetanus shot may be given.  Treatment for venomous snake bites may include antivenom medicine in addition to wound care. This medicine needs to be given as soon as possible after the bite. Other treatments may be needed to help control symptoms as they develop. You may need to stay in a hospital so your condition can be monitored. HOME CARE INSTRUCTIONS  Wound Care  Follow instructions from your health care provider about how to take care of your wound. Make sure you:   Wash your hands with soap and water before you change your dressing. If soap and water are not available, use hand sanitizer.  Change your dressing as told by your health care provider.  Keep the bite area clean and dry. Wash the bite area daily with soap and water or an antiseptic as told by your health care provider.  Check your  wound every day for signs of infection. Watch for:   Redness, swelling, or pain that is getting worse.   Fluid, blood, or pus.   If you develop blistering at the site of the bite, protect the blisters from breaking. Do not attempt to open a blister. Medicines  Take or apply over-the-counter and prescription medicines only as told by your health care provider.   If you were prescribed an antibiotic, take or apply it as told by your health care provider. Do not stop using the antibiotic even if your condition improves. General  Instructions  Keep the affected area raised (elevated) above the level of your heart while you are sitting or lying down, if possible.  Keep all follow-up visits as told by your health care provider. This is important. SEEK MEDICAL CARE IF:   You have increased redness, swelling, or pain at the site of your wound.  You have fluid, blood, or pus coming from your wound.  You have a fever. SEEK IMMEDIATE MEDICAL CARE IF:   You develop blood blisters or purple spots in the bite area.   You have nausea or vomiting.   You have numbness or tingling.   You have excessive sweating.   You have trouble breathing.   You have vision problems.   You feel very confused.  You feel faint or light-headed.    This information is not intended to replace advice given to you by your health care provider. Make sure you discuss any questions you have with your health care provider.   Document Released: 02/29/2000 Document Revised: 11/22/2014 Document Reviewed: 07/19/2014 Elsevier Interactive Patient Education [employer]2016 [employer] Inc.

## 2015-09-04 LAB — HEPATITIS C ANTIBODY: HCV Ab: <0.1 s/co ratio (ref 0.0–0.9)

## 2015-09-06 ENCOUNTER — Encounter: Payer: Self-pay | Admitting: Licensed Clinical Social Worker

## 2015-09-06 ENCOUNTER — Encounter: Payer: Self-pay | Admitting: Internal Medicine

## 2015-09-06 ENCOUNTER — Ambulatory Visit (INDEPENDENT_AMBULATORY_CARE_PROVIDER_SITE_OTHER): Payer: Self-pay | Admitting: Internal Medicine

## 2015-09-06 VITALS — BP 137/72 | HR 62 | Temp 98.2°F | Ht 71.0 in | Wt 140.0 lb

## 2015-09-06 DIAGNOSIS — Z0000 Encounter for general adult medical examination without abnormal findings: Secondary | ICD-10-CM

## 2015-09-06 DIAGNOSIS — F17210 Nicotine dependence, cigarettes, uncomplicated: Secondary | ICD-10-CM

## 2015-09-06 DIAGNOSIS — F1910 Other psychoactive substance abuse, uncomplicated: Secondary | ICD-10-CM

## 2015-09-06 DIAGNOSIS — Z720 Tobacco use: Secondary | ICD-10-CM

## 2015-09-06 DIAGNOSIS — T63091D Toxic effect of venom of other snake, accidental (unintentional), subsequent encounter: Secondary | ICD-10-CM

## 2015-09-06 DIAGNOSIS — Z8679 Personal history of other diseases of the circulatory system: Secondary | ICD-10-CM

## 2015-09-06 DIAGNOSIS — T63001D Toxic effect of unspecified snake venom, accidental (unintentional), subsequent encounter: Secondary | ICD-10-CM

## 2015-09-06 DIAGNOSIS — F1421 Cocaine dependence, in remission: Secondary | ICD-10-CM

## 2015-09-06 NOTE — Patient Instructions (Signed)
General Instructions: - Can use Benadryl for itching of both your snake bite and bee stings - Remember you cannot smoke cigarettes AND use the nicotine patch. Must stop smoking completely while on the patch. - Complete stool cards and bring back to clinic - Consider calling [facility] rehab program  Please bring your medicines with you each time you come to clinic.  Medicines may include prescription medications, over-the-counter medications, herbal remedies, eye drops, vitamins, or other pills.   Progress Toward Treatment Goals:  No flowsheet data found.  Self Care Goals & Plans:  Self Care Goal 09/06/2015  Manage my medications take my medicines as prescribed; bring my medications to every visit; refill my medications on time  Monitor my health keep track of my blood pressure  Eat healthy foods eat more vegetables; eat foods that are low in salt; eat baked foods instead of fried foods  Stop smoking cut down the number of cigarettes smoked    No flowsheet data found.   Care Management & Community Referrals:  No flowsheet data found.

## 2015-09-08 DIAGNOSIS — Z720 Tobacco use: Secondary | ICD-10-CM | POA: Insufficient documentation

## 2015-09-08 DIAGNOSIS — Z0000 Encounter for general adult medical examination without abnormal findings: Secondary | ICD-10-CM | POA: Insufficient documentation

## 2015-09-08 DIAGNOSIS — Z8679 Personal history of other diseases of the circulatory system: Secondary | ICD-10-CM | POA: Insufficient documentation

## 2015-09-08 DIAGNOSIS — F1910 Other psychoactive substance abuse, uncomplicated: Secondary | ICD-10-CM | POA: Insufficient documentation

## 2015-09-08 NOTE — Assessment & Plan Note (Signed)
Provided him with stool cards and directions for completing.

## 2015-09-08 NOTE — Assessment & Plan Note (Signed)
His snake bite appears to be healing well. Recommended for him to continue benadryl as needed for itching.

## 2015-09-08 NOTE — Assessment & Plan Note (Signed)
BP noted to be elevated during his hospital admission, but most likely secondary to pain from his snake bite. His BP is within normal range today.

## 2015-09-08 NOTE — Progress Notes (Signed)
   Subjective:    Patient ID: Gabriel MistyRobert M Barry, male    DOB: February 15, 1956, 60 y.o.   MRN: [id number]  HPI Gabriel Barry is a 59yo man with PMHx of tobacco abuse who presents today for follow up of his snake bite.  Snake bite: He was hospitalized from 6/18-6/19 after enduring a snake bite from a copperhead snake on his left index finger. He did not receive antivenom as he has no systemic signs of infection and no lab abnormalities. He reports his finger has been healing well and is only experiencing occasional itching and mild soreness. He has been using benadryl which provides relief.   Elevated BP: He had an elevated BP in the hospital. Today his BP is normal at 137/72.   Tobacco Abuse: He reports he was previously smoking 1.5 packs per day for 30+ years. He is currently smoking 5-8 cigarettes per day. He notes he has been using a nicotine patch since getting out of the hospital but is also smoking. He states he was able to quit for 2 years but then restarted with no known trigger. He wants to quit smoking now and plans to quit tomorrow.   Opioid Abuse: He has a long history of opioid abuse. He admits about 10 years ago he used to take Percocet/Roxicet 10 mg up to 10-20 pills per day. He is now using Suboxone daily which he gets "from a friend." He typically takes 1/4 of a pill daily and if he doesn't will experience withdrawal symptoms of shakiness and sweating. His last dose was this morning. 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 He denies any current IV drug use.    Review of Systems General: Denies fever, chills, night sweats, changes in weight, changes in appetite HEENT: Denies headaches, ear pain, changes in vision, rhinorrhea, sore throat CV: Denies CP, palpitations, SOB, orthopnea Pulm: Denies SOB, cough, wheezing GI: Reports infrequent abdominal pain and constipation which he associates with Suboxone use. Denies nausea, vomiting, diarrhea,  melena, hematochezia GU: Denies dysuria, hematuria, frequency Msk: Denies muscle cramps, joint pains Neuro: Denies weakness, numbness, tingling Skin: Denies bruising Psych: Denies depression, anxiety, hallucinations    Objective:   Physical Exam General: thin man sitting up, NAD HEENT: [place]/AT, EOMI, sclera anicteric, mucus membranes moist  CV: RRR, no m/g/r  Pulm: CTA bilaterally, breaths non-labored  Abd: BS+, soft, non-tender  Ext: LEFT hand shows a well healed puncture wound at the lateral aspect of left index finger PIP joint.No significant erythema or swelling. Full ROM of all fingers. Full ROM with wrist flexion/extension, eversion/inversion bilaterally.He has fullness of the MCP joints as well as other PIP and DIP joints bilaterally. All other joints have full ROM.  Neuro: alert and oriented x 3 Skin: Multiple tattoos on extremities     Assessment & Plan:  Please refer to A&P documentation.

## 2015-09-08 NOTE — Assessment & Plan Note (Signed)
We spent a long time discussing smoking cessation and the harms of continued tobacco abuse. Advised patient that he cannot smoke cigarettes and wear a nicotine patch simultaneously. He understands and plans to quit smoking completely tomorrow. I explained that I appreciated his enthusiasm to quit but not to be discouraged if he does not manage to quit completely by tomorrow. Emphasized to him to make a quit date when he feels confident he will be able to quit and start wearing the nicotine patch at that point. Will follow up at next visit if he was able to quit.

## 2015-09-08 NOTE — Assessment & Plan Note (Signed)
He has a long history of substance abuse, including cocaine and opioids. He admitted to me that he is still currently using Suboxone, but is interested in quitting. He states quitting has been difficult as he gets withdrawal symptoms easily. He has not been to a rehab center because he is unable to afford it. I provided him with information on some drug rehab programs that he can contact and will work with him while he is uninsured. He is working on getting the orange card. I also placed a consult to [employer] (social work) and she provided him with additional information. Will continue to encouraged him to abstain from drug abuse.

## 2015-09-09 NOTE — Progress Notes (Signed)
Medicine attending: Medical history, presenting problems, physical findings, and medications, reviewed with resident physician Dr Carly Rivet on the day of the patient visit and I concur with her evaluation and management plan. 

## 2015-09-12 NOTE — Progress Notes (Signed)
CSW briefly met with Mr. Cupps following his scheduled [facility] appointment.  Pt is currently uninsured and seeing resources for tapering off suboxone.  CSW provided Mr. Ryser with local Opioid Services in [facility], indicating those which accept uninsured/sliding fee scale.  Pt aware he will need to contact the agency of his choice to establish care.  Mr. Cortright denies add'l social work needs at this time.

## 2015-09-17 ENCOUNTER — Other Ambulatory Visit (INDEPENDENT_AMBULATORY_CARE_PROVIDER_SITE_OTHER): Payer: Self-pay

## 2015-09-17 DIAGNOSIS — Z1211 Encounter for screening for malignant neoplasm of colon: Secondary | ICD-10-CM

## 2015-09-17 DIAGNOSIS — Z0000 Encounter for general adult medical examination without abnormal findings: Secondary | ICD-10-CM

## 2015-09-17 LAB — POC HEMOCCULT BLD/STL (HOME/3-CARD/SCREEN)
FECAL OCCULT BLD: NEGATIVE
FECAL OCCULT BLD: NEGATIVE
Fecal Occult Blood, POC: NEGATIVE

## 2015-09-20 NOTE — Telephone Encounter (Signed)
No answer

## 2015-09-21 NOTE — Telephone Encounter (Signed)
LVM to return call.

## 2015-09-24 NOTE — Telephone Encounter (Signed)
Transition Care Management Follow-up Telephone Call   Date discharged? 09/03/2015   How have you been since you were released from the hospital? "pretty good, a little weak, but not bad"   Do you understand why you were in the hospital? yes   Do you understand the discharge instructions? yes   Where were you discharged to? Home   Items Reviewed:  Medications reviewed: yes  Allergies reviewed: no  Dietary changes reviewed: no  Referrals reviewed: no   Functional Questionnaire:   Activities of Daily Living (ADLs):   He states they are independent in the following: ambulation, bathing and hygiene, feeding, continence, grooming, toileting and dressing States they require assistance with the following: none   Any transportation issues/concerns?: no   Any patient concerns? no   Confirmed importance and date/time of follow-up visits scheduled yes, already came to follow up  Provider Appointment booked with  Confirmed with patient if condition begins to worsen call PCP or go to the ER.  Patient was given the office number and encouraged to call back with question or concerns.  : yes

## 2015-09-24 NOTE — Telephone Encounter (Signed)
LVM to return call.

## 2023-09-28 ENCOUNTER — Emergency Department (HOSPITAL_COMMUNITY)
Admission: EM | Admit: 2023-09-28 | Discharge: 2023-09-29 | Disposition: A | Discharge status: Home / Self Care | Payer: Self-pay | Attending: Emergency Medicine | Admitting: Emergency Medicine

## 2023-09-28 ENCOUNTER — Emergency Department (HOSPITAL_COMMUNITY): Payer: Self-pay

## 2023-09-28 ENCOUNTER — Encounter (HOSPITAL_COMMUNITY): Payer: Self-pay | Admitting: *Deleted

## 2023-09-28 ENCOUNTER — Other Ambulatory Visit: Payer: Self-pay

## 2023-09-28 DIAGNOSIS — N201 Calculus of ureter: Secondary | ICD-10-CM | POA: Insufficient documentation

## 2023-09-28 DIAGNOSIS — N23 Unspecified renal colic: Secondary | ICD-10-CM

## 2023-09-28 LAB — URINALYSIS, ROUTINE W REFLEX MICROSCOPIC
Bacteria, UA: NONE SEEN
Bilirubin Urine: NEGATIVE
Glucose, UA: NEGATIVE mg/dL
Ketones, ur: 5 mg/dL — AB
Leukocytes,Ua: NEGATIVE
Nitrite: NEGATIVE
Protein, ur: NEGATIVE mg/dL
Specific Gravity, Urine: 1.012 (ref 1.005–1.030)
pH: 5 (ref 5.0–8.0)

## 2023-09-28 LAB — COMPREHENSIVE METABOLIC PANEL WITH GFR
ALT: 20 U/L (ref 0–44)
AST: 29 U/L (ref 15–41)
Albumin: 4.3 g/dL (ref 3.5–5.0)
Alkaline Phosphatase: 66 U/L (ref 38–126)
Anion gap: 12 (ref 5–15)
BUN: 15 mg/dL (ref 8–23)
CO2: 24 mmol/L (ref 22–32)
Calcium: 9.7 mg/dL (ref 8.9–10.3)
Chloride: 100 mmol/L (ref 98–111)
Creatinine, Ser: 1.21 mg/dL (ref 0.61–1.24)
GFR, Estimated: >60 mL/min (ref >60)
Glucose, Bld: 120 mg/dL — ABNORMAL HIGH (ref 70–99)
Potassium: 4 mmol/L (ref 3.5–5.1)
Sodium: 136 mmol/L (ref 135–145)
Total Bilirubin: 0.9 mg/dL (ref 0.0–1.2)
Total Protein: 7.7 g/dL (ref 6.5–8.1)

## 2023-09-28 LAB — CBC WITH DIFFERENTIAL/PLATELET
Abs Immature Granulocytes: 0.08 K/uL — ABNORMAL HIGH (ref 0.00–0.07)
Basophils Absolute: 0.1 K/uL (ref 0.0–0.1)
Basophils Relative: 0 %
Eosinophils Absolute: 0 K/uL (ref 0.0–0.5)
Eosinophils Relative: 0 %
HCT: 49.9 % (ref 39.0–52.0)
Hemoglobin: 17.3 g/dL — ABNORMAL HIGH (ref 13.0–17.0)
Immature Granulocytes: 1 %
Lymphocytes Relative: 18 %
Lymphs Abs: 2.2 K/uL (ref 0.7–4.0)
MCH: 33.7 pg (ref 26.0–34.0)
MCHC: 34.7 g/dL (ref 30.0–36.0)
MCV: 97.1 fL (ref 80.0–100.0)
Monocytes Absolute: 1.5 K/uL — ABNORMAL HIGH (ref 0.1–1.0)
Monocytes Relative: 12 %
Neutro Abs: 8.9 K/uL — ABNORMAL HIGH (ref 1.7–7.7)
Neutrophils Relative %: 69 %
Platelets: 263 K/uL (ref 150–400)
RBC: 5.14 MIL/uL (ref 4.22–5.81)
RDW: 12.4 % (ref 11.5–15.5)
WBC: 12.8 K/uL — ABNORMAL HIGH (ref 4.0–10.5)
nRBC: 0 % (ref 0.0–0.2)

## 2023-09-28 LAB — CK: Total CK: 82 U/L (ref 49–397)

## 2023-09-28 MED ORDER — ONDANSETRON 4 MG PO TBDP: ORAL_TABLET | ORAL | 0 refills | Status: AC

## 2023-09-28 MED ORDER — ONDANSETRON 4 MG PO TBDP
4.0000 mg | ORAL_TABLET | Freq: Once | ORAL | Status: AC
  Administered 2023-09-28: 4 mg via ORAL
  Filled 2023-09-28: qty 1

## 2023-09-28 MED ORDER — OXYCODONE-ACETAMINOPHEN 5-325 MG PO TABS
1.0000 | ORAL_TABLET | Freq: Once | ORAL | Status: AC
  Administered 2023-09-28: 1 via ORAL
  Filled 2023-09-28: qty 1

## 2023-09-28 MED ORDER — MORPHINE SULFATE (PF) 4 MG/ML IV SOLN
4.0000 mg | Freq: Once | INTRAVENOUS | Status: AC
  Administered 2023-09-28: 4 mg via INTRAVENOUS
  Filled 2023-09-28: qty 1

## 2023-09-28 MED ORDER — KETOROLAC TROMETHAMINE 30 MG/ML IJ SOLN
15.0000 mg | Freq: Once | INTRAMUSCULAR | Status: AC
  Administered 2023-09-28: 15 mg via INTRAVENOUS
  Filled 2023-09-28: qty 1

## 2023-09-28 MED ORDER — IBUPROFEN 800 MG PO TABS: 800.0000 mg | ORAL_TABLET | Freq: Three times a day (TID) | ORAL | 0 refills | Status: AC

## 2023-09-28 MED ORDER — TAMSULOSIN HCL 0.4 MG PO CAPS: 0.4000 mg | ORAL_CAPSULE | Freq: Every day | ORAL | 0 refills | Status: AC

## 2023-09-28 MED ORDER — OXYCODONE-ACETAMINOPHEN 5-325 MG PO TABS: 1.0000 | ORAL_TABLET | Freq: Four times a day (QID) | ORAL | 0 refills | Status: AC | PRN

## 2023-09-28 MED ORDER — NICOTINE 21 MG/24HR TD PT24
21.0000 mg | MEDICATED_PATCH | Freq: Once | TRANSDERMAL | Status: DC
  Administered 2023-09-28: 21 mg via TRANSDERMAL
  Filled 2023-09-28: qty 1

## 2023-09-28 MED ORDER — IOHEXOL 300 MG/ML  SOLN
100.0000 mL | Freq: Once | INTRAMUSCULAR | Status: AC | PRN
  Administered 2023-09-28: 100 mL via INTRAVENOUS

## 2023-09-28 MED ORDER — SODIUM CHLORIDE 0.9 % IV BOLUS
1000.0000 mL | Freq: Once | INTRAVENOUS | Status: AC
  Administered 2023-09-28: 1000 mL via INTRAVENOUS

## 2023-09-28 NOTE — ED Notes (Signed)
 Patient states pain has came back and worse than before

## 2023-09-28 NOTE — ED Provider Triage Note (Signed)
 Emergency Medicine Provider Triage Evaluation Note  Gabriel Barry , a 68 y.o. male  was evaluated in triage.  Pt complains of hematuria.  Review of Systems  Positive: Hematuria, right side pain, nausea, vomiting Negative: Fever, h/o stones  Physical Exam  BP 112/78   Pulse 85   Temp 98.3 F (36.8 C) (Oral)   Resp 18   Wt 63.5 kg   SpO2 98%   BMI 19.53 kg/m  Gen:   Awake, no distress    Resp:  Normal effort   MSK:   Moves extremities without difficulty   Other:     Medical Decision Making  Medically screening exam initiated at 2:40 PM.  Appropriate orders placed.  Gabriel Barry was informed that the remainder of the evaluation will be completed by another provider, this initial triage assessment does not replace that evaluation, and the importance of remaining in the ED until their evaluation is complete.  Symptoms that started last night of suprapubic pain that extended to the right side abdomen. He has been nauseous, tried to make himself throw up but no vomiting.    Odell Balls, PA-C 09/28/23 1442

## 2023-09-28 NOTE — Discharge Instructions (Addendum)
 You have a small kidney stone on the right side  Please take Motrin  for pain  You can take Percocet for severe pain.  You can take Zofran  for nausea  I have prescribed Flomax  0.4 mg daily for 2 weeks  Please call urology next week if you have persistent pain  Return to ER if you have severe flank pain or  vomiting or fever

## 2023-09-28 NOTE — ED Provider Notes (Signed)
 [facility] AT [facility] Provider Note   CSN: [id number] Arrival date & time: 09/28/23  1403     Patient presents with: Hematuria   Gabriel Barry is a 68 y.o. male here presenting with hematuria and right flank pain.  Patient states that since last night, he has been having right-sided flank pain and also has blood in his urine.  He states that the urine is blood-tinged.  Patient also felt lightheaded dizzy.  Patient also has been urinating less than usual.  Patient has no history of kidney stones or kidney mass.   The history is provided by the patient.       Prior to Admission medications   Medication Sig Start Date End Date Taking? Authorizing Provider  acetaminophen  (TYLENOL ) 500 MG tablet Take 500 mg by mouth every 6 (six) hours as needed for mild pain.    [provider]  nicotine  (NICODERM CQ  - DOSED IN MG/24 HOURS) 21 mg/24hr patch Place 1 patch (21 mg total) onto the skin daily. 09/03/15   Prentiss Prentice SAILOR, DO    Allergies: Patient has no known allergies.    Review of Systems  Gastrointestinal:  Positive for abdominal pain.  Genitourinary:  Positive for hematuria.  All other systems reviewed and are negative.   Updated Vital Signs BP (!) 145/78 (BP Location: Left Arm)   Pulse (!) 59   Temp 98 F (36.7 C) (Oral)   Resp 16   Wt 63.5 kg   SpO2 100%   BMI 19.53 kg/m   Physical Exam Vitals and nursing note reviewed.  Constitutional:      Comments: Slightly uncomfortable  HENT:     Head: Normocephalic.     Nose: Nose normal.     Mouth/Throat:     Mouth: Mucous membranes are moist.  Eyes:     Extraocular Movements: Extraocular movements intact.     Pupils: Pupils are equal, round, and reactive to light.  Cardiovascular:     Rate and Rhythm: Normal rate and regular rhythm.     Pulses: Normal pulses.     Heart sounds: Normal heart sounds.  Pulmonary:     Effort: Pulmonary effort is normal.     Breath sounds:  Normal breath sounds.  Abdominal:     General: Abdomen is flat.     Comments: Mild right CVA tenderness  Musculoskeletal:        General: Normal range of motion.     Cervical back: Normal range of motion and neck supple.  Skin:    General: Skin is warm.     Capillary Refill: Capillary refill takes less than 2 seconds.  Neurological:     General: No focal deficit present.     Mental Status: He is oriented to person, place, and time.  Psychiatric:        Mood and Affect: Mood normal.        Behavior: Behavior normal.     (all labs ordered are listed, but only abnormal results are displayed) Labs Reviewed  CBC WITH DIFFERENTIAL/PLATELET - Abnormal; Notable for the following components:      Result Value   WBC 12.8 (*)    Hemoglobin 17.3 (*)    Neutro Abs 8.9 (*)    Monocytes Absolute 1.5 (*)    Abs Immature Granulocytes 0.08 (*)    All other components within normal limits  COMPREHENSIVE METABOLIC PANEL WITH GFR - Abnormal; Notable for the following components:   Glucose, Bld  120 (*)    All other components within normal limits  URINALYSIS, ROUTINE W REFLEX MICROSCOPIC - Abnormal; Notable for the following components:   Hgb urine dipstick MODERATE (*)    Ketones, ur 5 (*)    All other components within normal limits    EKG: None  Radiology: No results found.   Procedures   Medications Ordered in the ED  nicotine  (NICODERM CQ  - dosed in mg/24 hours) patch 21 mg (has no administration in time range)  oxyCODONE -acetaminophen  (PERCOCET/ROXICET) 5-325 MG per tablet 1 tablet (1 tablet Oral Given 09/28/23 1502)  ondansetron  (ZOFRAN -ODT) disintegrating tablet 4 mg (4 mg Oral Given 09/28/23 1502)  oxyCODONE -acetaminophen  (PERCOCET/ROXICET) 5-325 MG per tablet 1 tablet (1 tablet Oral Given 09/28/23 1718)  sodium chloride  0.9 % bolus 1,000 mL (1,000 mLs Intravenous New Bag/Given 09/28/23 2126)  morphine  (PF) 4 MG/ML injection 4 mg (4 mg Intravenous Given 09/28/23 2126)  iohexol   (OMNIPAQUE ) 300 MG/ML solution 100 mL (100 mLs Intravenous Contrast Given 09/28/23 2218)                                    Medical Decision Making Gabriel Barry is a 68 y.o. male here presenting with right flank pain and hematuria.  Consider kidney stone versus pyelonephritis versus rhabdomyolysis versus GU mass.  Plan to get CBC CMP and CK level and UA and CT abdomen pelvis.  11:10 PM I reviewed patient's labs and independently interpreted imaging studies.  White blood cell count is 12.8.  Urinalysis showed blood but no UTI.  CT showed 2 mm right UVJ stone.  Patient's pain is under control.  Will discharge home with pain medicine and Flomax  anti-inflammatories  Problems Addressed: Renal colic on right side: acute illness or injury  Amount and/or Complexity of Data Reviewed Labs: ordered. Decision-making details documented in ED Course. Radiology: ordered and independent interpretation performed. Decision-making details documented in ED Course.  Risk OTC drugs. Prescription drug management.    Final diagnoses:  None    ED Discharge Orders     None          Patt Alm Macho, MD 09/28/23 2311

## 2023-09-28 NOTE — ED Triage Notes (Signed)
 BIB [facility] from home for hematuria, frequency, chills, nausea, decreased u.o., decreased stream, R flank/ side pain, and dizziness. Onset 1700 last night. VSS. BS 114. No meds PTA. Seen by EDPA during triage.
